# Patient Record
Sex: Female | Born: 1985 | Race: Black or African American | Hispanic: No | Marital: Married | Smoking: Never smoker
Health system: Southern US, Community
[De-identification: ages and names within clinical notes are randomized; demographics above are authoritative.]

## PROBLEM LIST (undated history)

## (undated) DIAGNOSIS — R87629 Unspecified abnormal cytological findings in specimens from vagina: Secondary | ICD-10-CM

## (undated) DIAGNOSIS — E559 Vitamin D deficiency, unspecified: Secondary | ICD-10-CM

## (undated) HISTORY — PX: HYSTEROSCOPY: SHX211

## (undated) HISTORY — PX: DILATION AND CURETTAGE OF UTERUS: SHX78

---

## 2016-07-28 HISTORY — PX: COLPOSCOPY: SHX161

## 2017-05-08 LAB — OB RESULTS CONSOLE RPR: RPR: NONREACTIVE

## 2017-05-08 LAB — OB RESULTS CONSOLE HIV ANTIBODY (ROUTINE TESTING): HIV: NONREACTIVE

## 2017-05-08 LAB — OB RESULTS CONSOLE GC/CHLAMYDIA
CHLAMYDIA, DNA PROBE: NEGATIVE
Gonorrhea: NEGATIVE

## 2017-05-08 LAB — OB RESULTS CONSOLE HEPATITIS B SURFACE ANTIGEN: HEP B S AG: NEGATIVE

## 2017-05-08 LAB — OB RESULTS CONSOLE ABO/RH: RH Type: POSITIVE

## 2017-05-08 LAB — OB RESULTS CONSOLE RUBELLA ANTIBODY, IGM: RUBELLA: IMMUNE

## 2017-06-04 ENCOUNTER — Other Ambulatory Visit (HOSPITAL_COMMUNITY): Payer: Self-pay | Admitting: Obstetrics and Gynecology

## 2017-06-04 DIAGNOSIS — IMO0002 Reserved for concepts with insufficient information to code with codable children: Secondary | ICD-10-CM

## 2017-06-04 DIAGNOSIS — Z0489 Encounter for examination and observation for other specified reasons: Secondary | ICD-10-CM

## 2017-06-15 ENCOUNTER — Encounter (HOSPITAL_COMMUNITY): Payer: Self-pay | Admitting: *Deleted

## 2017-06-18 ENCOUNTER — Ambulatory Visit (HOSPITAL_COMMUNITY)
Admission: RE | Admit: 2017-06-18 | Discharge: 2017-06-18 | Disposition: A | Discharge status: Home / Self Care | Payer: 59 | Source: Ambulatory Visit | Attending: Obstetrics and Gynecology | Admitting: Obstetrics and Gynecology

## 2017-06-18 ENCOUNTER — Encounter (HOSPITAL_COMMUNITY): Payer: Self-pay

## 2017-06-18 DIAGNOSIS — IMO0002 Reserved for concepts with insufficient information to code with codable children: Secondary | ICD-10-CM

## 2017-06-18 DIAGNOSIS — Z363 Encounter for antenatal screening for malformations: Secondary | ICD-10-CM | POA: Insufficient documentation

## 2017-06-18 DIAGNOSIS — O364XX0 Maternal care for intrauterine death, not applicable or unspecified: Secondary | ICD-10-CM | POA: Diagnosis not present

## 2017-06-18 DIAGNOSIS — Z3A20 20 weeks gestation of pregnancy: Secondary | ICD-10-CM | POA: Insufficient documentation

## 2017-06-18 DIAGNOSIS — Z0489 Encounter for examination and observation for other specified reasons: Secondary | ICD-10-CM

## 2017-06-18 DIAGNOSIS — O99212 Obesity complicating pregnancy, second trimester: Secondary | ICD-10-CM | POA: Diagnosis not present

## 2017-06-18 HISTORY — DX: Unspecified abnormal cytological findings in specimens from vagina: R87.629

## 2017-06-18 HISTORY — DX: Vitamin D deficiency, unspecified: E55.9

## 2017-06-21 ENCOUNTER — Other Ambulatory Visit: Payer: Self-pay | Admitting: Obstetrics

## 2017-06-21 MED ORDER — MISOPROSTOL 25 MCG QUARTER TABLET: 400.0000 ug | ORAL_TABLET | ORAL | Status: DC

## 2017-06-21 MED ORDER — MISOPROSTOL 25 MCG QUARTER TABLET: 800.0000 ug | ORAL_TABLET | Freq: Once | ORAL | Status: DC

## 2017-06-22 ENCOUNTER — Inpatient Hospital Stay (HOSPITAL_COMMUNITY)
Admission: AD | Admit: 2017-06-22 | Discharge: 2017-06-23 | DRG: 779 | Disposition: A | Discharge status: Home / Self Care | Payer: 59 | Source: Ambulatory Visit | Attending: Obstetrics and Gynecology | Admitting: Obstetrics and Gynecology

## 2017-06-22 ENCOUNTER — Inpatient Hospital Stay (HOSPITAL_COMMUNITY): Payer: 59 | Admitting: Anesthesiology

## 2017-06-22 ENCOUNTER — Inpatient Hospital Stay (HOSPITAL_COMMUNITY): Payer: 59 | Admitting: Certified Registered Nurse Anesthetist

## 2017-06-22 ENCOUNTER — Encounter (HOSPITAL_COMMUNITY): Payer: Self-pay

## 2017-06-22 ENCOUNTER — Encounter (HOSPITAL_COMMUNITY)
Admission: AD | Disposition: A | Discharge status: Home / Self Care | Payer: Self-pay | Source: Ambulatory Visit | Attending: Obstetrics and Gynecology

## 2017-06-22 DIAGNOSIS — O364XX0 Maternal care for intrauterine death, not applicable or unspecified: Secondary | ICD-10-CM | POA: Diagnosis present

## 2017-06-22 DIAGNOSIS — O021 Missed abortion: Secondary | ICD-10-CM | POA: Diagnosis present

## 2017-06-22 DIAGNOSIS — Z3A18 18 weeks gestation of pregnancy: Secondary | ICD-10-CM | POA: Diagnosis not present

## 2017-06-22 HISTORY — PX: DILATION AND CURETTAGE OF UTERUS: SHX78

## 2017-06-22 LAB — CBC
HEMATOCRIT: 37.6 % (ref 36.0–46.0)
HEMOGLOBIN: 12.3 g/dL (ref 12.0–15.0)
MCH: 22.4 pg — ABNORMAL LOW (ref 26.0–34.0)
MCHC: 32.7 g/dL (ref 30.0–36.0)
MCV: 68.6 fL — AB (ref 78.0–100.0)
Platelets: 240 10*3/uL (ref 150–400)
RBC: 5.48 MIL/uL — AB (ref 3.87–5.11)
RDW: 18.5 % — ABNORMAL HIGH (ref 11.5–15.5)
WBC: 8.1 10*3/uL (ref 4.0–10.5)

## 2017-06-22 LAB — RPR: RPR Ser Ql: NONREACTIVE

## 2017-06-22 LAB — TYPE AND SCREEN
ABO/RH(D): A POS
ANTIBODY SCREEN: NEGATIVE

## 2017-06-22 LAB — ABO/RH: ABO/RH(D): A POS

## 2017-06-22 SURGERY — DILATION AND CURETTAGE
Anesthesia: Epidural | Site: Uterus | Wound class: Clean Contaminated

## 2017-06-22 MED ORDER — CLINDAMYCIN PHOSPHATE 900 MG/50ML IV SOLN: 900.0000 mg | Freq: Three times a day (TID) | INTRAVENOUS | Status: DC

## 2017-06-22 MED ORDER — EPHEDRINE 5 MG/ML INJ: 10.0000 mg | INTRAVENOUS | Status: DC | PRN

## 2017-06-22 MED ORDER — FENTANYL CITRATE (PF) 100 MCG/2ML IJ SOLN
INTRAMUSCULAR | Status: DC | PRN
  Administered 2017-06-22: 25 ug via INTRAVENOUS
  Administered 2017-06-22: 50 ug via INTRAVENOUS
  Administered 2017-06-22: 25 ug via INTRAVENOUS

## 2017-06-22 MED ORDER — PROPOFOL 10 MG/ML IV BOLUS
INTRAVENOUS | Status: AC
  Filled 2017-06-22: qty 20

## 2017-06-22 MED ORDER — PHENYLEPHRINE 40 MCG/ML (10ML) SYRINGE FOR IV PUSH (FOR BLOOD PRESSURE SUPPORT): 80.0000 ug | PREFILLED_SYRINGE | INTRAVENOUS | Status: DC | PRN

## 2017-06-22 MED ORDER — MEPERIDINE HCL 25 MG/ML IJ SOLN: 6.2500 mg | INTRAMUSCULAR | Status: DC | PRN

## 2017-06-22 MED ORDER — SOD CITRATE-CITRIC ACID 500-334 MG/5ML PO SOLN
30.0000 mL | ORAL | Status: DC | PRN
  Administered 2017-06-22: 30 mL via ORAL
  Filled 2017-06-22: qty 15

## 2017-06-22 MED ORDER — HYDROMORPHONE HCL 1 MG/ML IJ SOLN
0.2500 mg | INTRAMUSCULAR | Status: DC | PRN
  Administered 2017-06-22: 0.5 mg via INTRAVENOUS

## 2017-06-22 MED ORDER — DIPHENHYDRAMINE HCL 50 MG/ML IJ SOLN
12.5000 mg | INTRAMUSCULAR | Status: AC | PRN
  Administered 2017-06-22 (×3): 12.5 mg via INTRAVENOUS
  Filled 2017-06-22 (×2): qty 1

## 2017-06-22 MED ORDER — ACETAMINOPHEN 325 MG PO TABS: 650.0000 mg | ORAL_TABLET | ORAL | Status: DC | PRN

## 2017-06-22 MED ORDER — ACETAMINOPHEN 500 MG PO TABS
1000.0000 mg | ORAL_TABLET | Freq: Four times a day (QID) | ORAL | Status: DC | PRN
  Administered 2017-06-22: 1000 mg via ORAL
  Filled 2017-06-22: qty 2

## 2017-06-22 MED ORDER — LIDOCAINE HCL 1 % IJ SOLN
INTRAMUSCULAR | Status: AC
  Filled 2017-06-22: qty 20

## 2017-06-22 MED ORDER — ACETAMINOPHEN 10 MG/ML IV SOLN: 1000.0000 mg | Freq: Once | INTRAVENOUS | Status: DC | PRN

## 2017-06-22 MED ORDER — MISOPROSTOL 200 MCG PO TABS
400.0000 ug | ORAL_TABLET | Freq: Once | ORAL | Status: AC
  Administered 2017-06-22: 400 ug via ORAL
  Filled 2017-06-22: qty 2

## 2017-06-22 MED ORDER — PROMETHAZINE HCL 25 MG/ML IJ SOLN: 6.2500 mg | INTRAMUSCULAR | Status: DC | PRN

## 2017-06-22 MED ORDER — LIDOCAINE HCL (PF) 1 % IJ SOLN
30.0000 mL | INTRAMUSCULAR | Status: DC | PRN
  Filled 2017-06-22: qty 30

## 2017-06-22 MED ORDER — PHENYLEPHRINE 40 MCG/ML (10ML) SYRINGE FOR IV PUSH (FOR BLOOD PRESSURE SUPPORT)
80.0000 ug | PREFILLED_SYRINGE | INTRAVENOUS | Status: DC | PRN
  Filled 2017-06-22: qty 10

## 2017-06-22 MED ORDER — PROPOFOL 10 MG/ML IV BOLUS
INTRAVENOUS | Status: DC | PRN
  Administered 2017-06-22 (×3): 10 mg via INTRAVENOUS

## 2017-06-22 MED ORDER — LACTATED RINGERS IV SOLN: 500.0000 mL | Freq: Once | INTRAVENOUS | Status: DC

## 2017-06-22 MED ORDER — HYDROMORPHONE HCL 1 MG/ML IJ SOLN: 0.2500 mg | INTRAMUSCULAR | Status: DC | PRN

## 2017-06-22 MED ORDER — DIPHENOXYLATE-ATROPINE 2.5-0.025 MG PO TABS
2.0000 | ORAL_TABLET | Freq: Once | ORAL | Status: AC
  Administered 2017-06-22: 2 via ORAL
  Filled 2017-06-22: qty 2

## 2017-06-22 MED ORDER — HYDROMORPHONE HCL 1 MG/ML IJ SOLN
INTRAMUSCULAR | Status: AC
  Filled 2017-06-22: qty 0.5

## 2017-06-22 MED ORDER — FENTANYL 2.5 MCG/ML BUPIVACAINE 1/10 % EPIDURAL INFUSION (WH - ANES)
14.0000 mL/h | INTRAMUSCULAR | Status: DC | PRN
  Administered 2017-06-22 (×2): 14 mL/h via EPIDURAL
  Filled 2017-06-22 (×2): qty 100

## 2017-06-22 MED ORDER — ONDANSETRON HCL 4 MG/2ML IJ SOLN: 4.0000 mg | Freq: Four times a day (QID) | INTRAMUSCULAR | Status: DC | PRN

## 2017-06-22 MED ORDER — CARBOPROST TROMETHAMINE 250 MCG/ML IM SOLN
250.0000 ug | Freq: Once | INTRAMUSCULAR | Status: AC
  Administered 2017-06-22: 250 ug via INTRAMUSCULAR
  Filled 2017-06-22: qty 1

## 2017-06-22 MED ORDER — MISOPROSTOL 200 MCG PO TABS
200.0000 ug | ORAL_TABLET | Freq: Once | ORAL | Status: AC
  Administered 2017-06-22: 200 ug via BUCCAL
  Filled 2017-06-22: qty 1

## 2017-06-22 MED ORDER — FENTANYL CITRATE (PF) 100 MCG/2ML IJ SOLN
100.0000 ug | INTRAMUSCULAR | Status: DC | PRN
  Administered 2017-06-22 (×4): 100 ug via INTRAVENOUS
  Filled 2017-06-22 (×4): qty 2

## 2017-06-22 MED ORDER — MISOPROSTOL 200 MCG PO TABS
800.0000 ug | ORAL_TABLET | Freq: Once | ORAL | Status: AC
  Administered 2017-06-22: 800 ug via VAGINAL
  Filled 2017-06-22: qty 4

## 2017-06-22 MED ORDER — DEXTROSE 5 % IV SOLN
Freq: Three times a day (TID) | INTRAVENOUS | Status: DC
Start: 2017-06-22 — End: 2017-06-24
  Administered 2017-06-23 (×3): via INTRAVENOUS
  Filled 2017-06-22 (×4): qty 3.25

## 2017-06-22 MED ORDER — OXYTOCIN BOLUS FROM INFUSION
500.0000 mL | Freq: Once | INTRAVENOUS | Status: AC
  Administered 2017-06-22: 500 mL via INTRAVENOUS

## 2017-06-22 MED ORDER — MISOPROSTOL 200 MCG PO TABS
400.0000 ug | ORAL_TABLET | ORAL | Status: DC
  Administered 2017-06-22: 400 ug via ORAL

## 2017-06-22 MED ORDER — LIDOCAINE HCL 1 % IJ SOLN
INTRAMUSCULAR | Status: DC | PRN
  Administered 2017-06-22: 20 mL

## 2017-06-22 MED ORDER — LIDOCAINE HCL (CARDIAC) PF 100 MG/5ML IV SOSY
PREFILLED_SYRINGE | INTRAVENOUS | Status: DC | PRN
  Administered 2017-06-22: 25 mg via INTRATRACHEAL

## 2017-06-22 MED ORDER — MIDAZOLAM HCL 2 MG/2ML IJ SOLN
INTRAMUSCULAR | Status: DC | PRN
  Administered 2017-06-22: 2 mg via INTRAVENOUS

## 2017-06-22 MED ORDER — LIDOCAINE HCL (PF) 1 % IJ SOLN
INTRAMUSCULAR | Status: DC | PRN
  Administered 2017-06-22: 4 mL
  Administered 2017-06-22: 6 mL

## 2017-06-22 MED ORDER — OXYTOCIN 40 UNITS IN LACTATED RINGERS INFUSION - SIMPLE MED
2.5000 [IU]/h | INTRAVENOUS | Status: DC
  Filled 2017-06-22: qty 1000

## 2017-06-22 MED ORDER — LIDOCAINE HCL (CARDIAC) PF 100 MG/5ML IV SOSY
PREFILLED_SYRINGE | INTRAVENOUS | Status: AC
  Filled 2017-06-22: qty 5

## 2017-06-22 MED ORDER — MISOPROSTOL 200 MCG PO TABS
400.0000 ug | ORAL_TABLET | ORAL | Status: DC
  Administered 2017-06-22: 400 ug via VAGINAL
  Filled 2017-06-22 (×3): qty 2

## 2017-06-22 MED ORDER — LACTATED RINGERS IV SOLN
INTRAVENOUS | Status: DC
  Administered 2017-06-22 (×3): via INTRAVENOUS

## 2017-06-22 MED ORDER — LACTATED RINGERS IV SOLN: 500.0000 mL | INTRAVENOUS | Status: DC | PRN

## 2017-06-22 MED ORDER — ONDANSETRON HCL 4 MG/2ML IJ SOLN
INTRAMUSCULAR | Status: DC | PRN
  Administered 2017-06-22: 4 mg via INTRAVENOUS

## 2017-06-22 MED ORDER — LIDOCAINE-EPINEPHRINE 2 %-1:100000 IJ SOLN
INTRAMUSCULAR | Status: DC | PRN
  Administered 2017-06-22 (×2): 3 mL via INTRADERMAL
  Administered 2017-06-22: 7 mL via INTRADERMAL

## 2017-06-22 MED ORDER — FENTANYL CITRATE (PF) 100 MCG/2ML IJ SOLN
INTRAMUSCULAR | Status: AC
  Filled 2017-06-22: qty 2

## 2017-06-22 MED ORDER — MIDAZOLAM HCL 2 MG/2ML IJ SOLN
INTRAMUSCULAR | Status: AC
  Filled 2017-06-22: qty 2

## 2017-06-22 SURGICAL SUPPLY — 17 items
ADAPTER VACURETTE TBG SET 14 (CANNULA) ×3 IMPLANT
CATH ROBINSON RED A/P 16FR (CATHETERS) ×3 IMPLANT
CLOTH BEACON ORANGE TIMEOUT ST (SAFETY) ×3 IMPLANT
DECANTER SPIKE VIAL GLASS SM (MISCELLANEOUS) ×3 IMPLANT
GLOVE BIOGEL PI IND STRL 6.5 (GLOVE) ×1 IMPLANT
GLOVE BIOGEL PI IND STRL 7.0 (GLOVE) ×1 IMPLANT
GLOVE BIOGEL PI INDICATOR 6.5 (GLOVE) ×2
GLOVE BIOGEL PI INDICATOR 7.0 (GLOVE) ×2
GLOVE ECLIPSE 6.5 STRL STRAW (GLOVE) ×3 IMPLANT
GOWN STRL REUS W/TWL LRG LVL3 (GOWN DISPOSABLE) ×6 IMPLANT
NS IRRIG 1000ML POUR BTL (IV SOLUTION) ×3 IMPLANT
PACK VAGINAL MINOR WOMEN LF (CUSTOM PROCEDURE TRAY) ×3 IMPLANT
PAD OB MATERNITY 4.3X12.25 (PERSONAL CARE ITEMS) ×3 IMPLANT
PAD PREP 24X48 CUFFED NSTRL (MISCELLANEOUS) ×3 IMPLANT
TOWEL OR 17X24 6PK STRL BLUE (TOWEL DISPOSABLE) ×6 IMPLANT
TUBE VACURETTE 2ND TRIMESTER (CANNULA) ×3 IMPLANT
VACURETTE 14MM CVD 1/2 BASE (CANNULA) ×3 IMPLANT

## 2017-06-22 NOTE — Anesthesia Procedure Notes (Signed)
Epidural Patient location during procedure: OB  Staffing Anesthesiologist: Shaquanta Harkless, MD  Preanesthetic Checklist Completed: patient identified, pre-op evaluation, timeout performed, IV checked, risks and benefits discussed and monitors and equipment checked  Epidural Patient position: sitting Prep: DuraPrep Patient monitoring: blood pressure and continuous pulse ox Approach: right paramedian Location: L3-L4 Injection technique: LOR air  Needle:  Needle type: Tuohy  Needle gauge: 17 G Needle insertion depth: 8 cm Catheter type: closed end flexible Catheter size: 19 Gauge Catheter at skin depth: 15 cm Test dose: negative  Assessment Sensory level: T8  Additional Notes    Dosing of Epidural:  1st dose, through catheter .............................................  Xylocaine 40 mg  2nd dose, through catheter, after waiting 3 minutes.........Xylocaine 60 mg    As each dose occurred, patient was free of IV sx; and patient exhibited no evidence of SA injection.  Patient is more comfortable after epidural dosed. Please see RN's note for documentation of vital signs,and FHR which are stable.  Patient reminded not to try to ambulate with numb legs, and that an RN must be present when she attempts to get up.         

## 2017-06-22 NOTE — Brief Op Note (Signed)
06/22/2017  9:52 PM  PATIENT:  Judith Riddle  32 y.o. female  PRE-OPERATIVE DIAGNOSIS:  Products of retained fragmented placenta  POST-OPERATIVE DIAGNOSIS:  Same, but minimal RPOC  PROCEDURE:  Procedure(s): DILATATION AND CURETTAGE (N/A) with bedside US guidance  SURGEON:  Surgeon(s) and Role:    Claiborne Billings* Lindwood Mogel, DO - Primary  ANESTHESIA:   epidural and IV sedation  EBL:  25 mL   BLOOD ADMINISTERED:none  LOCAL MEDICATIONS USED:  LIDOCAINE   SPECIMEN:  PROC  DISPOSITION OF SPECIMEN:  PATHOLOGY  COUNTS:  YES  PLAN OF CARE: Admit to inpatient   PATIENT DISPOSITION:  PACU - hemodynamically stable.  FINDINGS: minimal material removed by suction currette, all 4 quadrants surveyed with sharp curette and good uterine cry noted.  US performed by me at bedside should thin EMS with moderate appearance of blood clot, no heavy bleeding pre, intra or post op.

## 2017-06-22 NOTE — Progress Notes (Signed)
Pharmacy Antibiotic Note  Judith Riddle is a 32 y.o. female admitted on 06/22/2017 for IOL during 2nd trimester (18+ weeks ??) due to IUFD .  Pharmacy has been consulted for Gentamicin dosing for endometritis.  Plan: Gentamicin 130mg  IV q8h Will continue to follow and assess need for further labs based on duration and clincial status of patient  Height: 5' (152.4 cm) Weight: 256 lb 6.3 oz (116.3 kg) IBW/kg (Calculated) : 45.5  Dosing/ Adjusted BW: 66.7kg  Temp (24hrs), Avg:99.1 F (37.3 C), Min:98 F (36.7 C), Max:100.5 F (38.1 C)  Recent Labs  Lab 06/22/17 0055  WBC 8.1    CrCl cannot be calculated (No order found.).    Allergies  Allergen Reactions  . Gold-Containing Drug Products   . Latex     Antimicrobials this admission: \Clindamycin 900mg  IV q8h  4/26 >>    Thank you for allowing pharmacy to be a part of this patient's care.  Claybon Jabsngel, Awilda Covin G 06/22/2017 8:57 PM

## 2017-06-22 NOTE — Progress Notes (Signed)
Estimated blood loss from 0901 to 2001 was 156 cc

## 2017-06-22 NOTE — Anesthesia Preprocedure Evaluation (Signed)
Anesthesia Evaluation  Patient identified by MRN, date of birth, ID band Patient awake    Reviewed: Allergy & Precautions, H&P , Patient's Chart, lab work & pertinent test results  Airway Mallampati: II  TM Distance: >3 FB Neck ROM: full    Dental  (+) Teeth Intact   Pulmonary    breath sounds clear to auscultation       Cardiovascular  Rhythm:regular Rate:Normal     Neuro/Psych    GI/Hepatic   Endo/Other    Renal/GU      Musculoskeletal   Abdominal   Peds  Hematology   Anesthesia Other Findings       Reproductive/Obstetrics (+) Pregnancy                             Anesthesia Physical Anesthesia Plan  ASA: III  Anesthesia Plan: Epidural   Post-op Pain Management:    Induction:   PONV Risk Score and Plan:   Airway Management Planned:   Additional Equipment:   Intra-op Plan:   Post-operative Plan:   Informed Consent: I have reviewed the patients History and Physical, chart, labs and discussed the procedure including the risks, benefits and alternatives for the proposed anesthesia with the patient or authorized representative who has indicated his/her understanding and acceptance.   Dental Advisory Given  Plan Discussed with:   Anesthesia Plan Comments: (Labs checked- platelets confirmed with RN in room. Fetal heart tracing, per RN, reported to be stable enough for sitting procedure. Discussed epidural, and patient consents to the procedure:  included risk of possible headache,backache, failed block, allergic reaction, and nerve injury. This patient was asked if she had any questions or concerns before the procedure started.)        Anesthesia Quick Evaluation  

## 2017-06-22 NOTE — H&P (Signed)
32 y.o. [redacted]w[redacted]d  G3P0110 presented for IOL for [redacted]w[redacted]d fetal deminse.  She denies bleeding, pain or other complaints.  Past Medical History:  Diagnosis Date  . Vaginal Pap smear, abnormal   . Vitamin D deficiency     Past Surgical History:  Procedure Laterality Date  . COLPOSCOPY  07/2016  . DILATION AND CURETTAGE OF UTERUS    . HYSTEROSCOPY     benign polyps    OB History  Gravida Para Term Preterm AB Living  3 1   1 1  0  SAB TAB Ectopic Multiple Live Births    1          # Outcome Date GA Lbr Len/2nd Weight Sex Delivery Anes PTL Lv  3 Current           2 TAB           1 Preterm         FD    Social History   Socioeconomic History  . Marital status: Married    Spouse name: Not on file  . Number of children: Not on file  . Years of education: Not on file  . Highest education level: Not on file  Occupational History  . Not on file  Social Needs  . Financial resource strain: Not on file  . Food insecurity:    Worry: Not on file    Inability: Not on file  . Transportation needs:    Medical: Not on file    Non-medical: Not on file  Tobacco Use  . Smoking status: Never Smoker  . Smokeless tobacco: Never Used  Substance and Sexual Activity  . Alcohol use: Never    Frequency: Never  . Drug use: Never  . Sexual activity: Not on file  Lifestyle  . Physical activity:    Days per week: Not on file    Minutes per session: Not on file  . Stress: Not on file  Relationships  . Social connections:    Talks on phone: Not on file    Gets together: Not on file    Attends religious service: Not on file    Active member of club or organization: Not on file    Attends meetings of clubs or organizations: Not on file    Relationship status: Not on file  . Intimate partner violence:    Fear of current or ex partner: Not on file    Emotionally abused: Not on file    Physically abused: Not on file    Forced sexual activity: Not on file  Other Topics Concern  . Not on file   Social History Narrative  . Not on file   Gold-containing drug products and Latex    Other PNC: uncomplicated.    Vitals:   06/22/17 1801 06/22/17 1831 06/22/17 1901 06/22/17 2000  BP: (!) 108/58 115/66 113/78 (!) 116/57  Pulse: 85 83 89 88  Resp: 18 20 18 18   Temp: 99.7 F (37.6 C)  99.1 F (37.3 C) (!) 100.5 F (38.1 C)  TempSrc: Axillary  Oral Axillary  SpO2:      Weight:      Height:        Lungs/Cor:  NAD Abdomen:  soft, gravid Ex:  no cords, erythema   A/P   Admitted for IOL for 2nd trimester fetal demise  Pt delivered approx 9am after administration of cytotec.  Placenta was felt in the vagina, pt pushed and gentle traction was placed on the cord.  Placenta, amnion and cord delivered.  On examination of placenta, coteyldons were missing, so additional cytotec was administered.  Pt has been monitored for fever, has had no heavy bleeding or passage of remaining placenta.  Temp is 100.5.  Advised will start abx for endometritis and bring pt to OR for D&E for RPOC. Gent and clinda will be administered pre-op and one dose postop  Dirck Butch

## 2017-06-22 NOTE — Transfer of Care (Signed)
Immediate Anesthesia Transfer of Care Note  Patient: Judith Riddle  Procedure(s) Performed: DILATATION AND CURETTAGE (N/A Uterus)  Patient Location: PACU  Anesthesia Type:MAC and Epidural  Level of Consciousness: awake, alert , oriented and patient cooperative  Airway & Oxygen Therapy: Patient Spontanous Breathing  Post-op Assessment: Report given to RN and Post -op Vital signs reviewed and stable  Post vital signs: Reviewed and stable  Last Vitals:  Vitals Value Taken Time  BP    Temp    Pulse    Resp    SpO2      Last Pain:  Vitals:   06/22/17 2144  TempSrc: Oral  PainSc: 0-No pain         Complications: No apparent anesthesia complications

## 2017-06-22 NOTE — Anesthesia Preprocedure Evaluation (Signed)
Anesthesia Evaluation  Patient identified by MRN, date of birth, ID band Patient awake    Reviewed: Allergy & Precautions, H&P , NPO status , Patient's Chart, lab work & pertinent test results  Airway Mallampati: II  TM Distance: >3 FB Neck ROM: full    Dental no notable dental hx. (+) Teeth Intact   Pulmonary neg pulmonary ROS,    breath sounds clear to auscultation       Cardiovascular  Rhythm:regular Rate:Normal     Neuro/Psych negative neurological ROS  negative psych ROS   GI/Hepatic negative GI ROS, Neg liver ROS,   Endo/Other  Morbid obesity  Renal/GU negative Renal ROS  negative genitourinary   Musculoskeletal negative musculoskeletal ROS (+)   Abdominal (+) + obese,   Peds  Hematology   Anesthesia Other Findings       Reproductive/Obstetrics (+) Pregnancy                             Anesthesia Physical  Anesthesia Plan  ASA: III  Anesthesia Plan: Epidural   Post-op Pain Management:    Induction:   PONV Risk Score and Plan: Ondansetron  Airway Management Planned: Natural Airway and Nasal Cannula  Additional Equipment:   Intra-op Plan:   Post-operative Plan:   Informed Consent: I have reviewed the patients History and Physical, chart, labs and discussed the procedure including the risks, benefits and alternatives for the proposed anesthesia with the patient or authorized representative who has indicated his/her understanding and acceptance.   Dental Advisory Given  Plan Discussed with:   Anesthesia Plan Comments: (Plan to use the labor  epidural with IV sedation)        Anesthesia Quick Evaluation

## 2017-06-22 NOTE — Progress Notes (Signed)
Orders received to continue epidural running until the rest of the placenta comes out.

## 2017-06-23 ENCOUNTER — Other Ambulatory Visit: Payer: Self-pay

## 2017-06-23 LAB — LUPUS ANTICOAGULANT PANEL
DRVVT: 40.1 s (ref 0.0–47.0)
PTT Lupus Anticoagulant: 29.6 s (ref 0.0–51.9)

## 2017-06-23 LAB — CBC
HCT: 31 % — ABNORMAL LOW (ref 36.0–46.0)
Hemoglobin: 10 g/dL — ABNORMAL LOW (ref 12.0–15.0)
MCH: 22.2 pg — AB (ref 26.0–34.0)
MCHC: 32.3 g/dL (ref 30.0–36.0)
MCV: 68.7 fL — AB (ref 78.0–100.0)
Platelets: 193 10*3/uL (ref 150–400)
RBC: 4.51 MIL/uL (ref 3.87–5.11)
RDW: 18.4 % — ABNORMAL HIGH (ref 11.5–15.5)
WBC: 7.4 10*3/uL (ref 4.0–10.5)

## 2017-06-23 LAB — BETA 2 MICROGLOBULIN, SERUM: BETA 2 MICROGLOBULIN: 1.8 mg/L (ref 0.6–2.4)

## 2017-06-23 MED ORDER — ONDANSETRON HCL 4 MG/2ML IJ SOLN: 4.0000 mg | INTRAMUSCULAR | Status: DC | PRN

## 2017-06-23 MED ORDER — DIBUCAINE 1 % RE OINT: 1.0000 "application " | TOPICAL_OINTMENT | RECTAL | Status: DC | PRN

## 2017-06-23 MED ORDER — SODIUM CHLORIDE 0.9% FLUSH
3.0000 mL | Freq: Three times a day (TID) | INTRAVENOUS | Status: DC
  Administered 2017-06-23 (×2): 3 mL via INTRAVENOUS

## 2017-06-23 MED ORDER — PRENATAL MULTIVITAMIN CH: 1.0000 | ORAL_TABLET | Freq: Every day | ORAL | Status: DC

## 2017-06-23 MED ORDER — IBUPROFEN 600 MG PO TABS
600.0000 mg | ORAL_TABLET | Freq: Four times a day (QID) | ORAL | Status: DC
  Administered 2017-06-23 (×4): 600 mg via ORAL
  Filled 2017-06-23 (×4): qty 1

## 2017-06-23 MED ORDER — WITCH HAZEL-GLYCERIN EX PADS: 1.0000 "application " | MEDICATED_PAD | CUTANEOUS | Status: DC | PRN

## 2017-06-23 MED ORDER — OXYCODONE-ACETAMINOPHEN 5-325 MG PO TABS
2.0000 | ORAL_TABLET | ORAL | Status: DC | PRN
  Administered 2017-06-23 (×2): 2 via ORAL
  Filled 2017-06-23 (×2): qty 2

## 2017-06-23 MED ORDER — ZOLPIDEM TARTRATE 5 MG PO TABS: 5.0000 mg | ORAL_TABLET | Freq: Every evening | ORAL | Status: DC | PRN

## 2017-06-23 MED ORDER — SIMETHICONE 80 MG PO CHEW: 80.0000 mg | CHEWABLE_TABLET | ORAL | Status: DC | PRN

## 2017-06-23 MED ORDER — COCONUT OIL OIL: 1.0000 "application " | TOPICAL_OIL | Status: DC | PRN

## 2017-06-23 MED ORDER — OXYCODONE-ACETAMINOPHEN 5-325 MG PO TABS
1.0000 | ORAL_TABLET | ORAL | Status: DC | PRN
  Administered 2017-06-23: 1 via ORAL
  Filled 2017-06-23: qty 1

## 2017-06-23 MED ORDER — DIPHENHYDRAMINE HCL 25 MG PO CAPS
25.0000 mg | ORAL_CAPSULE | Freq: Four times a day (QID) | ORAL | Status: DC | PRN
  Administered 2017-06-23 (×3): 25 mg via ORAL
  Filled 2017-06-23 (×3): qty 1

## 2017-06-23 MED ORDER — ACETAMINOPHEN 325 MG PO TABS: 650.0000 mg | ORAL_TABLET | ORAL | Status: DC | PRN

## 2017-06-23 MED ORDER — BENZOCAINE-MENTHOL 20-0.5 % EX AERO
1.0000 "application " | INHALATION_SPRAY | CUTANEOUS | Status: DC | PRN
  Administered 2017-06-23: 1 via TOPICAL
  Filled 2017-06-23: qty 56

## 2017-06-23 MED ORDER — SENNOSIDES-DOCUSATE SODIUM 8.6-50 MG PO TABS
2.0000 | ORAL_TABLET | ORAL | Status: DC
  Administered 2017-06-23: 2 via ORAL
  Filled 2017-06-23: qty 2

## 2017-06-23 MED ORDER — TETANUS-DIPHTH-ACELL PERTUSSIS 5-2.5-18.5 LF-MCG/0.5 IM SUSP: 0.5000 mL | Freq: Once | INTRAMUSCULAR | Status: DC

## 2017-06-23 MED ORDER — ONDANSETRON HCL 4 MG PO TABS: 4.0000 mg | ORAL_TABLET | ORAL | Status: DC | PRN

## 2017-06-23 NOTE — Progress Notes (Signed)
0600: We offered to assist patient out of bed but pt refused at this time. Pt wishes to ambulate later this morning. We will try again.

## 2017-06-23 NOTE — Progress Notes (Signed)
   06/22/17 2356  Vital Signs  BP 124/70  BP Location Left Arm  Patient Position (if appropriate) Lying  BP Method Automatic  Pulse Rate 86  Resp 18  Temp 98.5 F (36.9 C)  Oxygen Therapy  SpO2 100 %  O2 Device Room Air  We received patient from PACU awake, alert and oriented x 4. Pt appears sad and tearful, we extended our empathy and pt said "thank you". Pt was accompanied by her husband, her mother and her mother-in-law. . Pts' pain was 3/10 post Dilaudid 0.5 mg IV and did not require any pain medication at this time. Pt was  assessed,oriented to room and settled comfortable to bed with call bell in close reach.

## 2017-06-23 NOTE — Progress Notes (Signed)
Patient is eating, ambulating, foley in place.  Pain control is good.  Pt denies abd tenderness, fever.  She reports light bleeding.  Denies any other complaints.  Vitals:   06/22/17 2315 06/22/17 2345 06/22/17 2356 06/23/17 0755  BP: 116/70  124/70 108/64  Pulse: 79 91 86 77  Resp: Temp: 98.8 F (37.1 C)  98.5 F (36.9 C) 98.7 F (37.1 C)  TempSrc:    Oral  SpO2: 99% 100% 100% 100%  Weight:      Height:        Abd/pelvis: non-tender Ext: no CT  Lab Results  Component Value Date   WBC 7.4 06/23/2017   HGB 10.0 (L) 06/23/2017   HCT 31.0 (L) 06/23/2017   MCV 68.7 (L) 06/23/2017   PLT 193 06/23/2017    --/--/A POS Performed at [facility], [address] [place], [place] [address]  (04/26 0111)  A/P Post partum day #1, POD #1 s/p D&E for suspected RPOC Pt developed temp prior to D&E last evening, abx given pre-op and will continue to approx 24 hours op.  Pt feeling well now, no complaints.  Will likely d/c home tonight if continues to do well.  Routine care.   Philip Aspen

## 2017-06-23 NOTE — Plan of Care (Signed)
Pt. Recvd. Last dose of IV ABX then D/C home. Reviewed all d/c paperwork. All questions answered. Dr. Claiborne Billings aware of pt's departure this Pm.

## 2017-06-23 NOTE — Anesthesia Postprocedure Evaluation (Signed)
Anesthesia Post Note  Patient: Probation officer  Procedure(s) Performed: DILATATION AND CURETTAGE (N/A Uterus)     Patient location during evaluation: [facility] Anesthesia Type: Epidural Level of consciousness: awake and alert Pain management: pain level controlled Vital Signs Assessment: post-procedure vital signs reviewed and stable Respiratory status: spontaneous breathing Cardiovascular status: blood pressure returned to baseline Postop Assessment: no headache, no backache, patient able to bend at knees, no apparent nausea or vomiting, epidural receding and adequate PO intake Anesthetic complications: no    Last Vitals:  Vitals:   06/22/17 2345 06/22/17 2356  BP:  124/70  Pulse: 91 86  Resp: 13 18  Temp:  36.9 C  SpO2: 100% 100%    Last Pain:  Vitals:   06/23/17 0608  TempSrc:   PainSc: 5    Pain Goal: Patients Stated Pain Goal: 2 (06/23/17 0608)               Salome Arnt

## 2017-06-23 NOTE — Discharge Summary (Signed)
Pt was admitted for IOL of 21 week demise measuring 18.6.  She received cytotec overnight and by 9am delivered fetal en caul.  Placenta was felt in vagina and gentle traction facilitated delivery.  Placenta was examined, small and macerated.  At the time, there was suspicion for remaining POC.  Pt received add'l cytotec and was monitored.  She did not have heavy bleeding at any time.  By 12 hours post delivery no additional placental material was apparent in peripads.  She developed fever to 100.5.  Decision was made to bring patient to OR for examination and D&E for retained products.  Minimal materail was evacuated.  Please see op note for details.  Pt received pre-op abx of clindamycin and gentamycin for suspected chorioamnionitis d/t RPOC, this was continued to 24hrs afebrile.  Pt remained stable throughout and desired discharge home at 24 hours.  Precautions for endometritis, bleeding or further RPOC were reviewed and patient was instructed to f/u in office 1 week or call for eval sooner with any problems.

## 2017-06-23 NOTE — Progress Notes (Signed)
Spoke with Dr. Rogue Bussing regarding specimen collection for fetal microarray. Per Jenetta Downer, nursing supervisor, specimen needs to be collected by MD. I called lab and spoke with [facility], who stated she would send collection kit to unit. Dr. Rogue Bussing stated she would come to nursing unit to obtain collection kit tomorrow prior to collecting specimen.

## 2017-06-24 ENCOUNTER — Encounter (HOSPITAL_COMMUNITY): Payer: Self-pay | Admitting: Obstetrics and Gynecology

## 2017-06-26 LAB — CARDIOLIPIN ANTIBODIES, IGG, IGM, IGA
Anticardiolipin IgA: <9 APL U/mL (ref 0–11)
Anticardiolipin IgG: <9 GPL U/mL (ref 0–14)
Anticardiolipin IgM: <9 MPL U/mL (ref 0–12)

## 2017-06-26 NOTE — Anesthesia Postprocedure Evaluation (Signed)
Anesthesia Post Note  Patient: Probation officer  Procedure(s) Performed: AN AD HOC LABOR EPIDURAL     Patient location during evaluation: Mother Baby Anesthesia Type: Epidural Level of consciousness: awake and alert Pain management: pain level controlled Vital Signs Assessment: post-procedure vital signs reviewed and stable Respiratory status: spontaneous breathing, nonlabored ventilation and respiratory function stable Cardiovascular status: stable Postop Assessment: no headache, no backache and epidural receding Anesthetic complications: no    Last Vitals:  Vitals:   06/23/17 1554 06/23/17 1944  BP: 115/66 103/76  Pulse: 80 83  Resp: 17 18  Temp: 36.9 C 36.9 C  SpO2: 99% 100%    Last Pain:  Vitals:   06/23/17 2046  TempSrc:   PainSc: 0-No pain   Pain Goal: Patients Stated Pain Goal: 2 (06/23/17 1610)               Cristela Blue EDWARD

## 2017-07-03 ENCOUNTER — Ambulatory Visit (HOSPITAL_COMMUNITY): Payer: 59

## 2017-07-06 LAB — MICROARRAY TO WFUBMC

## 2017-07-11 LAB — CHROMOSOME STD, POC(TISSUE)-NCBH

## 2017-07-26 NOTE — Op Note (Signed)
NAMECHENISE, MULVIHILL MEDICAL RECORD ZO:[id number] ACCOUNT [id number] DATE OF BIRTH:09-22-1985 FACILITY: [facility] LOCATION: [id number] PHYSICIAN:Pascale Maves M. Claiborne Billings, DO  OPERATIVE REPORT  DATE OF PROCEDURE:  06/22/2017  Of note, this dictation was performed previously.  I was alerted that the dictation was somehow lost.  This is the best approximation of dictation approximately 1 month later.    PREOPERATIVE DIAGNOSIS:  Retained products of conception.  POSTOPERATIVE DIAGNOSIS:  Retained products of conception, minimal retained products of conception noted.  PROCEDURE:  Dilation and curettage with ultrasound guidance at bedside.  SURGEON:  Philip Aspen, DO  ANESTHESIA:  Epidural with IV sedation.  ESTIMATED BLOOD LOSS:  Less than 50 mL.  Local medications 10 mL of lidocaine paracervical block.  SPECIMENS:  Routine products of conception.  FINDINGS:  Minimal material from curettage of all 4 quadrants.  Ultrasound performed by myself at bedside showed a thin endometrial stripe with moderate appearance of blood clot.    DESCRIPTION OF PROCEDURE:  The patient was taken to the operating room where anesthesia was administered and found to be adequate.  She was prepped and draped in the normal sterile fashion in dorsal lithotomy position.  A speculum was placed and with  good visualization of the cervix, the anterior lip was grasped with a single tooth tenaculum.  The cervix was already sufficiently dilated and a curette was entered very gently through the uterine os while concurrently performing bedside ultrasound to  watch the advancing curet and to avoid any perforation of the uterus.  Gentle curettage of all 4 quadrants was performed.  Findings noted above.  The patient tolerated the procedure well.  All instruments were removed from the uterus, cervix and vagina.   Products were sent to pathology.  The patient tolerated the procedure well and was taken to PACU in stable  condition.  TN/NUANCE  D:07/25/2017 T:07/25/2017 JOB:[id number]

## 2017-07-27 ENCOUNTER — Other Ambulatory Visit: Payer: Self-pay

## 2017-07-27 ENCOUNTER — Ambulatory Visit (HOSPITAL_COMMUNITY)
Admission: RE | Admit: 2017-07-27 | Discharge: 2017-07-27 | Disposition: A | Discharge status: Home / Self Care | Payer: 59 | Source: Ambulatory Visit | Attending: Obstetrics and Gynecology | Admitting: Obstetrics and Gynecology

## 2017-07-27 ENCOUNTER — Encounter (HOSPITAL_COMMUNITY): Payer: Self-pay

## 2017-07-27 DIAGNOSIS — Z8279 Family history of other congenital malformations, deformations and chromosomal abnormalities: Secondary | ICD-10-CM | POA: Insufficient documentation

## 2017-07-27 DIAGNOSIS — Z315 Encounter for genetic counseling: Secondary | ICD-10-CM | POA: Insufficient documentation

## 2017-07-27 NOTE — Consult Note (Signed)
Maternal Fetal Medicine Consultation  I had the pleasure of seeing your patient Judith Riddle for Maternal-Fetal Medicine consultation on 07/27/2017. As you know, Judith Riddle is a 32 y.o. [id number] who presents for preconception consultation regarding a history of recurrent IUFD.  Judith Riddle's first pregnancy was a first trimester TAB. Her second pregnancy in 7096 was complicated by an IUFD at [redacted]w[redacted]d She reports the pregnancy had been notable for elevated inhibin and was offered an amniocentesis but declined. I do not have these records. NMairinreports that around 27 weeks she experienced abdominal pain and on presentation to her OB was diagnosed with fetal demise. She underwent induction of labor which was uncomplicated. Her son weighed 391 grams at birth which is <1%ile for gestational age. Evaluation was negative for APAS, Factor V Leiden, prothrombin gene mutation. TSH and CBC were normal. Type and screen, RPR and a urine drug screen were negative. Antithrombin III was just below the lower limit of normal at 74%.  Fetal Xray showed sequelae from delivery. 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 There was no evidence of hemorrhage. Karyotype was normal female 46XY.   Following this pregnancy Judith Riddle had difficulty conceiving and was found to have uterine polyps for which she underwent hysteroscopic resection.  She became pregnant in December of 2018 and reports an uncomplicated pregnancy [facility] and was diagnosed with a fetal demise at 21 weeks. The ultrasound showed a 19 week sized fetus with nuchal skin edema extending down the length of the body posteriorly, small, bilateral pleural effusions and mild ventriculomegaly.  No other gross abnormalities were identified but the exam was noted to be limited. Judith Riddle again underwent induction of labor. Delivery was complicated by  retained products of conception requiring a D&C and endometritis. Evaluation was again negative for APAS. Microarray showed a normal female. Placenta path again showed multiple infarcts.   Judith [place] again last week. I do not have records from either of these consults. Last week the MFM recommended starting twice daily Riddle-dose aspirin now and prophylactic dose Riddle molecular weight heparin (LMWH) with a diagnosis of pregnancy.  NAlyse Lowreports being told she has a thalassemia trait but I have no records of this. NAlyse Lowhas no other significant medical history. She denies a history of hypertension, diabetes, or venus thromboembolic disease. She denies tobacco or drug use and works from home. She does use several herbal supplements and drinks.   She takes prenatal vitamins, iron and vitamin D and is allergic to no medications. Her family history is negative for VTE or recurrent stillbirth or miscarriage.  I discussed Judith Riddle's history of recurrent IUFD in detail. We discussed that in both cases she's had a comprehensive evaluation which has been negative for chromosomal disorders, APAS, thrombophilia, thyroid disease or infection. I reviewed that both babies were very small with placental pathology showing a large burden of infarction. Most likely the IUFDs were caused by placental infarction leading to placental insufficiency, severe IUGR, and fetal demise. We discussed that the pathophysiology for recurrent placental infarction is unclear but is thought to be due to dysfunction at the uteroplacental interface. We discussed the available evidence in detail. Lower quality evidence has suggested that LMWH may be beneficial in improving obstetric outcomes, but newer evidence has not shown a benefit to either LMWH or Riddle-dose aspirin in  this case. There are several ongoing trials that may provide further guidance on this issue in the future. We  also discussed the risks of aspirin and LMWH treatment. At this time Judith Riddle desires to continue LDASA twice daily as she is and would like to plan on LMWH when any future pregnancy is confirmed. I think this is reasonable as long as she understands the limited evidence of benefit which I believe she does.  Judith Riddle also met with our genetic counseled Santiago Glad Corneliussen to discussed testing for thalassemias and elected to proceed with expanded carrier screening. Please see separate consult note for full details.  I also discussed my recommendations for early ultrasound for dating purposes in any future pregnancy, detailed anatomy ultrasound at 18 weeks, and serial ultrasounds for growth and antenatal testing in the third trimester.  Finally, I recommended Judith Riddle discontinue any herbal supplements she is taking since these are not regulated and have not been shown to be safe in pregnancy. I also recommend she adhere to general healthy lifestyle recommendations including moderate exercise and an anti-inflammatory diet such as the Mediterranean diet emphasizing fruits, vegetables, whole grains and healthy proteins.   Thank you for the opportunity to be a part of the care of Judith Riddle. Please contact our office if we can be of further assistance.   I spent approximately 60 minutes with this patient with over 50% of time spent in face-to-face counseling.  Abram Sander, MD Maternal-Fetal Medicine

## 2017-07-27 NOTE — Progress Notes (Signed)
Genetic Counseling  Preconception Note  Appointment Date:  07/27/2017 Referred By: Allyn Kenner, DO Date of Birth:  05-02-1985   Pregnancy History: [id number]  Attending: Abram Sander, MD   I met with [name] for preconception genetic counseling given family history of sickle cell trait and possible thalassemia trait. [name] was seen today for MFM consultation regarding her pregnancy history. See separate consult note for detailed discussion.   In summary:  Discussed family history of possible alpha thal trait and sickle cell trait for patient's husband and possible beta thalassemia trait for patient  Discussed autosomal recessive inheritance  Medical records not available to confirm carrier status of patient's husband  Hemoglobin electrophoresis for patient from 05/08/17 indicated presence of normal adult hemoglobin (Hb AA)- not suggestive of thalassemia trait  Reviewed options of repeat carrier screening for patient  Patient elected to pursue molecular carrier screening for hemoglobinopathies today  Discussed additional option of pan-ethnic carrier screening- patient elected to pursue expanded carrier screen ([facility]) today  We began by reviewing the family history in detail. 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 Her husband also reportedly has sickle cell trait. Recent hemoglobin electrophoresis from 05/08/17 for the patient indicated the presence of normal adult hemoglobin, which is not suggestive of beta thalassemia trait. However, we discussed the option of repeat carrier screening through hemoglobin electrophoresis or through molecular analysis of HBB gene. We reviewed chromosomes, genes, and the autosomal recessive inheritance of hemoglobinopathies. We discussed that when both parents are carriers of a beta globin chain variant, the chance for a  pregnancy together to be affected with the conditions is 1 in 4 (25%). Similarly, a pregnancy would only be at risk for alpha thalassemia, if both parents were carriers, and the specific carrier status (such as number of alpha globin chain deletions) would impact the nature of the risk. After careful consideration, [name] elected to proceed with carrier testing for hemoglobinopathies through molecular testing today.   The patient also reported a full brother who was identified on prenatal ultrasound in 1989 to have polyps on both kidneys which reduced the amount of amniotic fluid, leading him not to be able to swallow fluid in pregnancy. He passed away shortly after birth. We discussed that this description could possibly fit with polycystic kidney disease, which can occur due to autosomal recessive or autosomal dominant inheritance, though the autosomal dominant form is less commonly observed prenatally. Additionally, the description could fit with other anomalies of the kidney and urinary tract such as multicystic dysplastic kidneys which can occur sporadically or part of an underlying genetic syndrome. We discussed that if her brother had an autosomal recessive condition, then [name] would have a 2 out of 3 chance to be a carrier but that recurrence risk to her offspring would depend upon both carrier status of herself and the father of the pregnancy. The patient is aware that her brother may have had a different underlying cause for his features for which we may not have available testing. We discussed that in the case of autosomal recessive polycystic kidney disease, carrier screening for the more commonly causative gene is available on expanded carrier screening panels. The patient understands that expanded carrier screening would not assess for all possible genetic causes of congenital kidney disease. The family histories were otherwise found to be noncontributory for birth defects, intellectual  disability, and known  genetic conditions. Judith Riddle and her husband have no known consanguinity. Without further information regarding the provided family history, an accurate genetic risk cannot be calculated. Further genetic counseling is warranted if more information is obtained.  We discussed the option of expanded pan-ethnic carrier screening for a select panel of autosomal recessive and some X-linked conditions for the patient. ACOG currently recommends that all patients be offered carrier screening for cystic fibrosis, spinal muscular atrophy and hemoglobinopathies. In addition, she was counseled that there are a variety of genetic screening laboratories that have pan-ethnic, or expanded, carrier screening panels, which evaluate carrier status for a wide range of genetic conditions. Some of these conditions are severe and actionable, but also rare; others occur more commonly, but are less severe. We discussed that testing options range from screening for a single condition to panels of more than 200 autosomal or X-linked genetic conditions. The prevalence of each condition varies (and often varies with ethnicity). Thus the couples' background risk to be a carrier for each of these various conditions would range, and in some cases be very low or unknown. We reviewed that a negative carrier screen would thus reduce, but not eliminate the chance to be a carrier for these conditions. For some conditions included on specific pan-ethnic carrier screening panels, the phenotype may not yet be well defined. For the majority of conditions on pan-ethnic carrier screening panels, identification of carrier status is not expected to be associated with medical features for the carrier; However, there are currently few exceptions where carrier status has been shown to increase the chance for certain medical concerns. We reviewed that in the event that one partner is found to be a carrier for one or more  conditions, carrier screening would be available to the partner for those conditions. We discussed the risks, benefits, and limitations of carrier screening with the patient and discussed potential out of pocket costs.  After thoughtful consideration of their options, Mrs. Judith Riddle elected to pursue expanded pan-ethnic carrier screening (including ACOG recommended panel) at this time. Foresight panel was drawn through [employer], and this panel includes 175 conditions.    I counseled Judith Riddle regarding the above risks and available options.  The approximate face-to-face time with the genetic counselor was 25 minutes.  Chipper Oman, MS Certified Genetic Counselor 07/27/2017 3:21 PM

## 2017-08-03 ENCOUNTER — Other Ambulatory Visit: Payer: Self-pay

## 2017-08-09 ENCOUNTER — Telehealth (HOSPITAL_COMMUNITY): Payer: Self-pay | Admitting: MS"

## 2017-08-09 NOTE — Telephone Encounter (Signed)
Called Judith Riddle to discuss her carrier screening results. She had expanded carrier screening through [facility], which screened for 175 conditions. The patient was identified by name and DOB. We reviewed that the results are negative for all of the conditions for which analysis was performed, with the exception of alpha thalassemia and 21-hydroxylas-deficient congenital Adrenal Hyperplasia.    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   We briefly discussed 21-OH CAH and the autosomal recessive inheritance and various features. Prior to carrier screening for her husband, his risk to be a carrier is the general population chance of approximately 1 in 120.   We discussed the option of carrier screening for her partner.She was counseled that the laboratory can send a saliva collection kit to them or he can come to the office to have his blood drawn. She plans to discuss with him and contact our office should they desire to pursue screening for him.   We reviewed that carrier screening does not detect all carriers of all of these conditions, but a normal result significantly decreases the likelihood of being a carrier, and therefore, the overall reproductive risk. We reviewed that [facility] sequences most of the genes, which is associated with a high detection rate for carriers, thus a negative screen is very reassuring. All questions were answered to her satisfaction, she was encouraged to call with additional questions or concerns. ? Chipper Oman  MS Insurance risk surveyor

## 2017-08-09 NOTE — Telephone Encounter (Signed)
Left message for patient. Calling regarding results of carrier screening panel performed at there last visit at [facility]. Left message for patient to return call.   Clydie BraunKaren Kemper Hochman 08/09/2017 4:26 PM

## 2019-03-03 ENCOUNTER — Encounter (HOSPITAL_COMMUNITY): Payer: Self-pay

## 2019-03-03 ENCOUNTER — Ambulatory Visit (INDEPENDENT_AMBULATORY_CARE_PROVIDER_SITE_OTHER): Payer: Managed Care, Other (non HMO)

## 2019-03-03 ENCOUNTER — Other Ambulatory Visit: Payer: Self-pay

## 2019-03-03 ENCOUNTER — Ambulatory Visit (HOSPITAL_COMMUNITY)
Admission: EM | Admit: 2019-03-03 | Discharge: 2019-03-03 | Disposition: A | Discharge status: Home / Self Care | Payer: Managed Care, Other (non HMO) | Attending: Family Medicine | Admitting: Family Medicine

## 2019-03-03 DIAGNOSIS — M79602 Pain in left arm: Secondary | ICD-10-CM

## 2019-03-03 DIAGNOSIS — M545 Low back pain, unspecified: Secondary | ICD-10-CM

## 2019-03-03 DIAGNOSIS — M25512 Pain in left shoulder: Secondary | ICD-10-CM | POA: Diagnosis not present

## 2019-03-03 DIAGNOSIS — Z3202 Encounter for pregnancy test, result negative: Secondary | ICD-10-CM

## 2019-03-03 DIAGNOSIS — M546 Pain in thoracic spine: Secondary | ICD-10-CM

## 2019-03-03 LAB — POCT PREGNANCY, URINE: Preg Test, Ur: NEGATIVE

## 2019-03-03 LAB — POC URINE PREG, ED: Preg Test, Ur: NEGATIVE

## 2019-03-03 MED ORDER — IBUPROFEN 800 MG PO TABS: 800.0000 mg | ORAL_TABLET | Freq: Three times a day (TID) | ORAL | 0 refills | Status: AC

## 2019-03-03 MED ORDER — CYCLOBENZAPRINE HCL 5 MG PO TABS: 5.0000 mg | ORAL_TABLET | Freq: Two times a day (BID) | ORAL | 0 refills | Status: AC | PRN
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
## 2019-03-03 NOTE — Discharge Instructions (Signed)
Use anti-inflammatories for pain/swelling. You may take up to 800 mg Ibuprofen every 8 hours with food. You may supplement Ibuprofen with Tylenol 500-1000 mg every 8 hours.  You may use flexeril as needed to help with pain. This is a muscle relaxer and causes sedation- please use only at bedtime or when you will be home and not have to drive/work Alternate ice and heat Gentle stretching Follow-up if not improving or worsening 
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
